# Patient Record
Sex: Male | Born: 1975 | Race: White | Hispanic: No | Marital: Single | Smoking: Former smoker
Health system: Southern US, Community
[De-identification: ages and names within clinical notes are randomized; demographics above are authoritative.]

---

## 2003-09-04 ENCOUNTER — Emergency Department (HOSPITAL_COMMUNITY): Admission: EM | Admit: 2003-09-04 | Discharge: 2003-09-04 | Payer: Self-pay | Admitting: Emergency Medicine

## 2004-12-31 ENCOUNTER — Emergency Department (HOSPITAL_COMMUNITY): Admission: EM | Admit: 2004-12-31 | Discharge: 2005-01-01 | Payer: Self-pay | Admitting: Emergency Medicine

## 2005-09-19 ENCOUNTER — Emergency Department (HOSPITAL_COMMUNITY): Admission: EM | Admit: 2005-09-19 | Discharge: 2005-09-19 | Payer: Self-pay | Admitting: Emergency Medicine

## 2008-03-30 ENCOUNTER — Ambulatory Visit: Payer: Self-pay | Admitting: Internal Medicine

## 2008-03-30 DIAGNOSIS — D2330 Other benign neoplasm of skin of unspecified part of face: Secondary | ICD-10-CM

## 2008-03-30 DIAGNOSIS — F528 Other sexual dysfunction not due to a substance or known physiological condition: Secondary | ICD-10-CM

## 2008-03-30 LAB — CONVERTED CEMR LAB
AST: 42 units/L — ABNORMAL HIGH (ref 0–37)
Albumin: 4.3 g/dL (ref 3.5–5.2)
Cholesterol: 226 mg/dL (ref 0–200)
Crystals: NEGATIVE
Direct LDL: 142.9 mg/dL
Eosinophils Absolute: 0.3 10*3/uL (ref 0.0–0.7)
GFR calc Af Amer: 100 mL/min
GFR calc non Af Amer: 82 mL/min
HCT: 49 % (ref 39.0–52.0)
HDL: 43.6 mg/dL (ref >39.0)
Hemoglobin, Urine: NEGATIVE
MCV: 95.4 fL (ref 78.0–100.0)
Monocytes Absolute: 0.5 10*3/uL (ref 0.1–1.0)
Monocytes Relative: 8.6 % (ref 3.0–12.0)
Neutrophils Relative %: 51.9 % (ref 43.0–77.0)
Nitrite: NEGATIVE
Platelets: 220 10*3/uL (ref 150–400)
RDW: 11.4 % — ABNORMAL LOW (ref 11.5–14.6)
Sodium: 140 meq/L (ref 135–145)
Specific Gravity, Urine: 1.025 (ref 1.000–1.03)
Testosterone: 398.64 ng/dL (ref 350.00–890)
Total CHOL/HDL Ratio: 5.2
Triglycerides: 169 mg/dL — ABNORMAL HIGH (ref 0–149)
Urobilinogen, UA: 0.2 (ref 0.0–1.0)

## 2008-03-31 DIAGNOSIS — J4520 Mild intermittent asthma, uncomplicated: Secondary | ICD-10-CM

## 2008-04-02 ENCOUNTER — Encounter: Payer: Self-pay | Admitting: Internal Medicine

## 2008-04-17 ENCOUNTER — Ambulatory Visit: Payer: Self-pay | Admitting: Internal Medicine

## 2008-04-17 DIAGNOSIS — R748 Abnormal levels of other serum enzymes: Secondary | ICD-10-CM | POA: Insufficient documentation

## 2008-04-17 DIAGNOSIS — F329 Major depressive disorder, single episode, unspecified: Secondary | ICD-10-CM

## 2008-04-17 LAB — CONVERTED CEMR LAB
AST: 18 units/L (ref 0–37)
Alkaline Phosphatase: 49 units/L (ref 39–117)
Bilirubin, Direct: 0.1 mg/dL (ref 0.0–0.3)
HCV Ab: NEGATIVE
Hep A Total Ab: NEGATIVE
Hepatitis B Surface Ag: NEGATIVE
Total Bilirubin: 1.2 mg/dL (ref 0.3–1.2)

## 2008-04-19 ENCOUNTER — Encounter: Payer: Self-pay | Admitting: Internal Medicine

## 2008-06-08 ENCOUNTER — Telehealth: Payer: Self-pay | Admitting: Internal Medicine

## 2008-06-19 ENCOUNTER — Ambulatory Visit: Payer: Self-pay | Admitting: Internal Medicine

## 2008-06-19 ENCOUNTER — Encounter: Payer: Self-pay | Admitting: Internal Medicine

## 2008-06-21 ENCOUNTER — Encounter: Admission: RE | Admit: 2008-06-21 | Discharge: 2008-06-21 | Payer: Self-pay | Admitting: Internal Medicine

## 2008-06-26 ENCOUNTER — Telehealth: Payer: Self-pay | Admitting: Internal Medicine

## 2008-06-27 ENCOUNTER — Encounter: Payer: Self-pay | Admitting: Internal Medicine

## 2008-06-29 ENCOUNTER — Encounter: Payer: Self-pay | Admitting: Internal Medicine

## 2008-07-13 ENCOUNTER — Telehealth: Payer: Self-pay | Admitting: Internal Medicine

## 2008-07-21 ENCOUNTER — Ambulatory Visit: Payer: Self-pay | Admitting: Endocrinology

## 2009-06-18 ENCOUNTER — Ambulatory Visit: Payer: Self-pay | Admitting: Internal Medicine

## 2009-06-19 ENCOUNTER — Encounter: Payer: Self-pay | Admitting: Internal Medicine

## 2009-07-03 ENCOUNTER — Encounter: Payer: Self-pay | Admitting: Internal Medicine

## 2009-09-05 ENCOUNTER — Ambulatory Visit (HOSPITAL_COMMUNITY): Admission: RE | Admit: 2009-09-05 | Discharge: 2009-09-05 | Payer: Self-pay | Admitting: Orthopedic Surgery

## 2010-03-21 ENCOUNTER — Ambulatory Visit: Payer: Self-pay | Admitting: Internal Medicine

## 2010-03-21 DIAGNOSIS — J45901 Unspecified asthma with (acute) exacerbation: Secondary | ICD-10-CM | POA: Insufficient documentation

## 2010-06-07 IMAGING — RF DG FLUORO GUIDE NDL PLC/BX
3 series · 3 of 3 positions shown · IV contrast (multihance)
Comparison: none

CLINICAL DATA: Snowboarding accident.  Pain at the left ring
finger MCP joint.  Question radial collateral ligament tear.

LEFT FOURTH MCP JOINT  INJECTION UNDER FLUOROSCOPY
The procedure, risks, benefits, and alternatives were explained to
the patient. Questions regarding the procedure were encouraged and
answered. Written consent was obtained.
TECHNIQUE: An appropriate skin entrance site was determined under
fluoroscopy. The site was marked, prepped with Betadine, draped in
the usual sterile fashion. Lidocaine was used for local anesthesia.
Subsequently, a 25 gauge butterfly needle was advanced into the
left fourth MCP joint under intermittent fluoroscopy.
Approximately 1 cc of a mixture of 0.05 ml Multihance in 5 ml
Sanjaen 60 and 5 cc lidocaine was then used to opacify the left
fourth MCP joint.  No immediate complication.

[Series 1: run · 1 of 1 slices shown (1 of 3)]
[im 1/1]
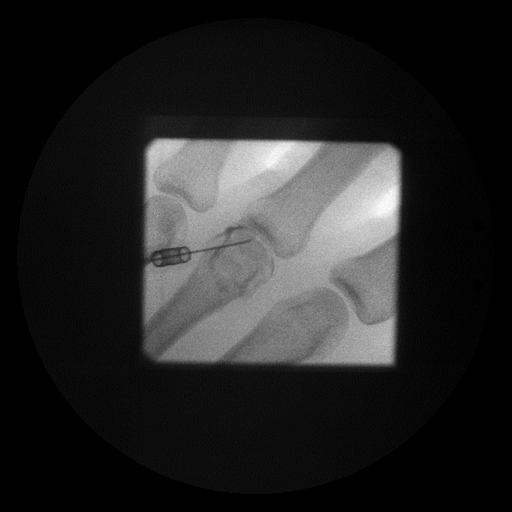

[Series 2: run · 1 of 1 slices shown (2 of 3)]
[im 1/1]
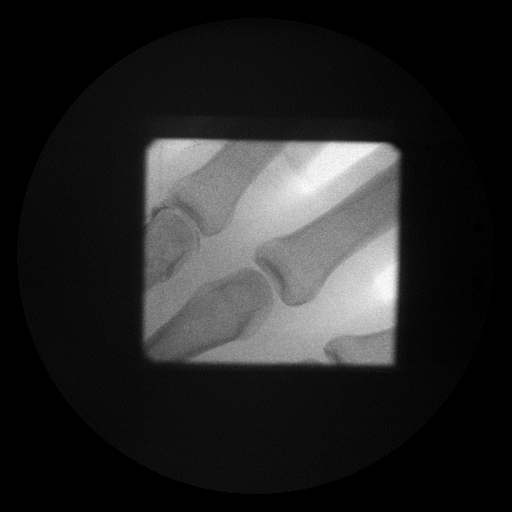

[Series 3: run · 1 of 1 slices shown (3 of 3)]
[im 1/1]
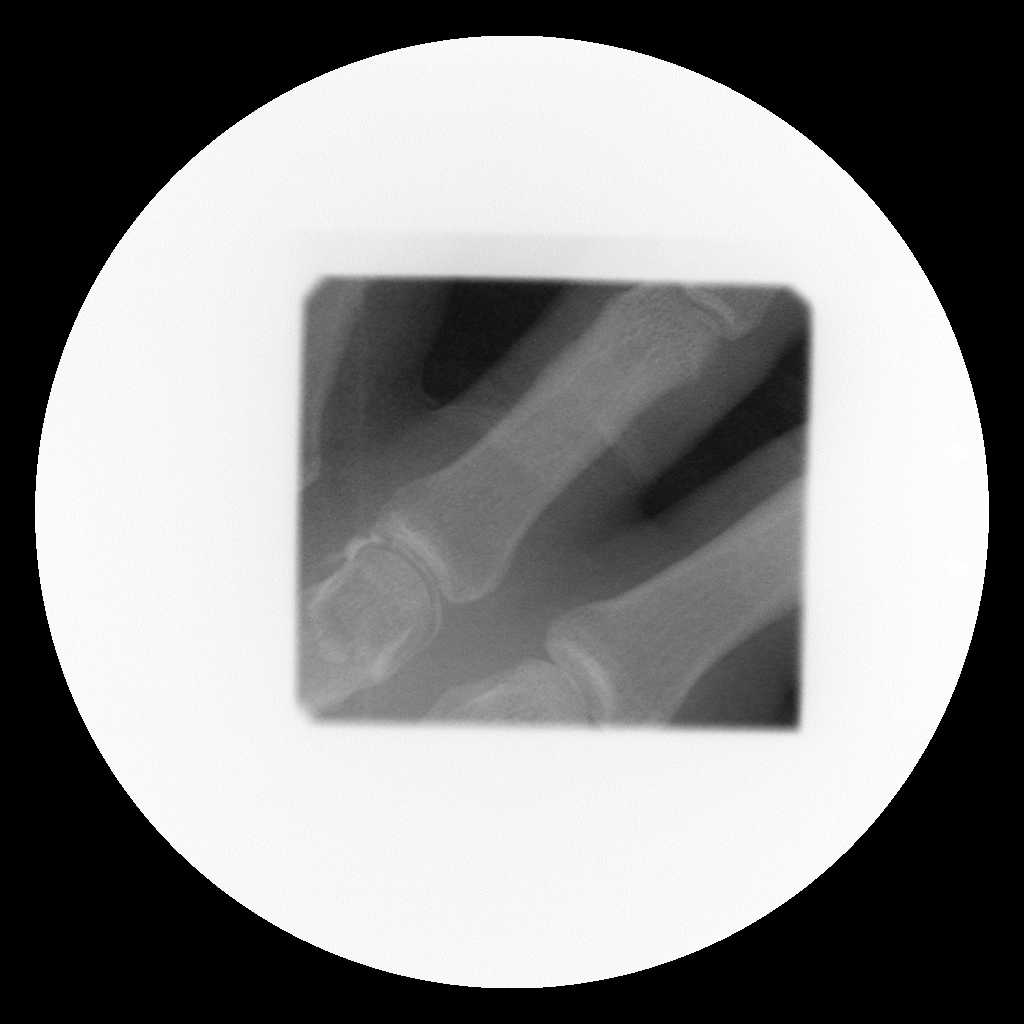

[3 of 3 positions shown; findings below may reference images not displayed]

IMPRESSION: Technically successful left fourth MCP joint injection for MRI.

## 2010-06-18 NOTE — Assessment & Plan Note (Signed)
Summary: HEAD CHEST CONGEST--RUNNY NOSE  CHEST TIGHTNESS X LAST WK/TUE...   Vital Signs:  Patient profile:   35 year old male Height:      67 inches Weight:      153 pounds BMI:     24.05 O2 Sat:      92 % on Room air Temp:     98.7 degrees F oral Pulse rate:   60 / minute Pulse rhythm:   regular Resp:     16 per minute BP sitting:   120 / 80  (left arm) Cuff size:   large  Vitals Entered By: Rock Nephew CMA (March 21, 2010 3:10 PM)  O2 Flow:  Room air CC: pt c/o cough, congestion, body ache, , URI symptoms   Primary Care Provider:  Etta Grandchild MD  CC:  pt c/o cough, congestion, body ache, , and URI symptoms.  History of Present Illness:  URI Symptoms      This is a 35 year old man who presents with URI symptoms.  The symptoms began 1 week ago.  The severity is described as mild.  The patient reports nasal congestion, clear nasal discharge, and dry cough, but denies purulent nasal discharge, sore throat, productive cough, earache, and sick contacts.  Associated symptoms include dyspnea and wheezing.  The patient denies fever, stiff neck, rash, vomiting, diarrhea, use of an antipyretic, and response to antipyretic.  The patient also reports sneezing and seasonal symptoms.  The patient denies headache, muscle aches, and severe fatigue.  The patient denies the following risk factors for Strep sinusitis: unilateral facial pain, unilateral nasal discharge, poor response to decongestant, double sickening, tooth pain, Strep exposure, tender adenopathy, and absence of cough.    Preventive Screening-Counseling & Management  Alcohol-Tobacco     Alcohol drinks/day: 1     Alcohol type: all     >5/day in last 3 mos: no     Alcohol Counseling: not indicated; use of alcohol is not excessive or problematic     Feels need to cut down: no     Feels annoyed by complaints: no     Feels guilty re: drinking: no     Needs 'eye opener' in am: no     Smoking Status: quit     Year Quit:  2008     Pack years: 10     Passive Smoke Exposure: no     Tobacco Counseling: to remain off tobacco products  Hep-HIV-STD-Contraception     Hepatitis Risk: no risk noted     HIV Risk: no     STD Risk: no risk noted      Drug Use:  no.    Medications Prior to Update: 1)  Advair Diskus 100-50 Mcg/dose Misc (Fluticasone-Salmeterol) .... Inhale 1 Dose By Mouth Twice A Day 2)  Levitra 20 Mg Tabs (Vardenafil Hcl) .... As Directed 3)  Proair Hfa 108 (90 Base) Mcg/act Aers (Albuterol Sulfate) .... 2 Puffs Qid For Asthma  Current Medications (verified): 1)  Advair Diskus 100-50 Mcg/dose Misc (Fluticasone-Salmeterol) .... Inhale 1 Dose By Mouth Twice A Day 2)  Levitra 20 Mg Tabs (Vardenafil Hcl) .... As Directed 3)  Proair Hfa 108 (90 Base) Mcg/act Aers (Albuterol Sulfate) .... 2 Puffs Qid For Asthma  Allergies (verified): 1)  ! Fluzone (Influenza Virus Vaccine Split)  Past History:  Past Medical History: Last updated: 03/30/2008 Asthma  Past Surgical History: Last updated: 03/30/2008 Denies surgical history  Family History: Last updated: 03/30/2008 Mother-COPD Father-Diverticulitis  Family History Diabetes 1st degree relative  Social History: Last updated: 04/17/2008 Single Alcohol use-yes Drug use-no Regular exercise-yes  Risk Factors: Alcohol Use: 1 (03/21/2010) >5 drinks/d w/in last 3 months: no (03/21/2010) Exercise: yes (04/17/2008)  Risk Factors: Smoking Status: quit (03/21/2010) Passive Smoke Exposure: no (03/21/2010)  Family History: Reviewed history from 03/30/2008 and no changes required. Mother-COPD Father-Diverticulitis Family History Diabetes 1st degree relative  Social History: Reviewed history from 04/17/2008 and no changes required. Single Alcohol use-yes Drug use-no Regular exercise-yes Hepatitis Risk:  no risk noted STD Risk:  no risk noted  Review of Systems  The patient denies anorexia, fever, weight loss, weight gain, hoarseness,  chest pain, syncope, dyspnea on exertion, peripheral edema, headaches, hemoptysis, abdominal pain, suspicious skin lesions, enlarged lymph nodes, and angioedema.    Physical Exam  General:  alert, well-developed, well-nourished, well-hydrated, appropriate dress, normal appearance, healthy-appearing, and cooperative to examination.   Head:  normocephalic, atraumatic, no abnormalities observed, and no abnormalities palpated.   Ears:  R ear normal and L ear normal.   Nose:  External nasal examination shows no deformity or inflammation. Nasal mucosa are pink and moist without lesions or exudates. Mouth:  Oral mucosa and oropharynx without lesions or exudates.  Teeth in good repair. Neck:  supple, full ROM, no masses, no thyromegaly, no JVD, normal carotid upstroke, no carotid bruits, no cervical lymphadenopathy, and no neck tenderness.   Lungs:  normal respiratory effort, no intercostal retractions, no accessory muscle use, normal breath sounds, no dullness, no fremitus, no crackles, R wheezes, and L wheezes.   Heart:  normal rate, regular rhythm, no murmur, no gallop, no rub, and no JVD.   Abdomen:  soft, non-tender, normal bowel sounds, no distention, no masses, no guarding, no rigidity, no rebound tenderness, no abdominal hernia, no inguinal hernia, no hepatomegaly, and no splenomegaly.   Msk:  No deformity or scoliosis noted of thoracic or lumbar spine.   Pulses:  R and L carotid,radial,femoral,dorsalis pedis and posterior tibial pulses are full and equal bilaterally Extremities:  No clubbing, cyanosis, edema, or deformity noted with normal full range of motion of all joints.   Neurologic:  No cranial nerve deficits noted. Station and gait are normal. Plantar reflexes are down-going bilaterally. DTRs are symmetrical throughout. Sensory, motor and coordinative functions appear intact. Skin:  turgor normal, color normal, no rashes, no suspicious lesions, no ecchymoses, no petechiae, no purpura, no  ulcerations, and no edema.   Cervical Nodes:  no anterior cervical adenopathy and no posterior cervical adenopathy.   Axillary Nodes:  no R axillary adenopathy and no L axillary adenopathy.   Inguinal Nodes:  no R inguinal adenopathy and no L inguinal adenopathy.   Psych:  Cognition and judgment appear intact. Alert and cooperative with normal attention span and concentration. No apparent delusions, illusions, hallucinations Additional Exam:  He received a jet neb of Xopenex 1.25 mg and afterwards his lungs are CTA on both sides   Impression & Recommendations:  Problem # 1:  ASTHMA UNSPECIFIED WITH EXACERBATION (ZOX-096.04) Assessment New  His updated medication list for this problem includes:    Advair Diskus 100-50 Mcg/dose Misc (Fluticasone-salmeterol) ..... Inhale 1 dose by mouth twice a day    Proair Hfa 108 (90 Base) Mcg/act Aers (Albuterol sulfate) .Marland Kitchen... 2 puffs qid for asthma  Orders: Nebulizer Tx (54098)  Problem # 2:  ASTHMA (ICD-493.90) Assessment: Deteriorated  His updated medication list for this problem includes:    Advair Diskus 100-50 Mcg/dose Misc (Fluticasone-salmeterol) .Marland KitchenMarland KitchenMarland KitchenMarland Kitchen  Inhale 1 dose by mouth twice a day    Proair Hfa 108 (90 Base) Mcg/act Aers (Albuterol sulfate) .Marland Kitchen... 2 puffs qid for asthma  Orders: Admin of Therapeutic Inj  intramuscular or subcutaneous (16109) Depo- Medrol 40mg  (J1030)  Complete Medication List: 1)  Advair Diskus 100-50 Mcg/dose Misc (Fluticasone-salmeterol) .... Inhale 1 dose by mouth twice a day 2)  Levitra 20 Mg Tabs (Vardenafil hcl) .... As directed 3)  Proair Hfa 108 (90 Base) Mcg/act Aers (Albuterol sulfate) .... 2 puffs qid for asthma  Patient Instructions: 1)  Please schedule a follow-up appointment in 2 weeks. 2)  Get plenty of rest, drink lots of clear liquids, and use Tylenol or Ibuprofen for fever and comfort. Return in 7-10 days if you're not better:sooner if you're feeling worse. Prescriptions: ADVAIR DISKUS 100-50  MCG/DOSE MISC (FLUTICASONE-SALMETEROL) inhale 1 dose by mouth twice a day  #40mo x 11   Entered and Authorized by:   Etta Grandchild MD   Signed by:   Etta Grandchild MD on 03/21/2010   Method used:   Electronically to        [facility]. [phone]* (retail)       [phone]       [place], [place]  [address]       Ph: [phone]       Fax: [phone]   RxID:   [id number]    Medication Administration  Injection # 1:    Medication: Depo- Medrol 40mg     Diagnosis: ASTHMA (ICD-493.90)    Route: IM    Site: L deltoid    Exp Date: 08/2012    Lot #: [id number]    Mfr: [email or web address]    Comments: pt received 120mg     Patient tolerated injection without complications    Given by: Rock Nephew CMA (March 21, 2010 3:37 PM)  Orders Added: 1)  Admin of Therapeutic Inj  intramuscular or subcutaneous [96372] 2)  Depo- Medrol 40mg  [J1030] 3)  Est. Patient Level IV [36644] 4)  Nebulizer Tx [03474]     Medication Administration  Injection # 1:    Medication: Depo- Medrol 40mg     Diagnosis: ASTHMA (ICD-493.90)    Route: IM    Site: L deltoid    Exp Date: 08/2012    Lot #: [id number]    Mfr: [email or web address]    Comments: pt received 120mg     Patient tolerated injection without complications    Given by: Rock Nephew CMA (March 21, 2010 3:37 PM)  Orders Added: 1)  Admin of Therapeutic Inj  intramuscular or subcutaneous [96372] 2)  Depo- Medrol 40mg  [J1030] 3)  Est. Patient Level IV [25956] 4)  Nebulizer Tx [38756]

## 2010-06-18 NOTE — Consult Note (Signed)
Summary: [facility]  [facility]   Imported By: Lester [place] 06/25/2009 07:58:15  _____________________________________________________________________  External Attachment:    Type:   Image     Comment:   External Document

## 2010-06-18 NOTE — Letter (Signed)
Summary: [facility]  [facility]   Imported By: Sherian Rein 07/09/2009 13:55:33  _____________________________________________________________________  External Attachment:    Type:   Image     Comment:   External Document

## 2010-06-18 NOTE — Assessment & Plan Note (Signed)
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 No Pain Assessment Patient in pain? yes     Location: (L) hand Type: aching/swollen   Primary Care Provider:  Etta Grandchild MD  CC:  Injured (L) hand.  History of Present Illness: here c/o left hand injury - onset sat (>48  ago) - uncertain what ocurred when he fell - ?hit hand against pole or board instant pain and swelling in palm of hand a/w bruising -  no wrist pain, not wearing wrist gaurds -  no head injury or LOC - felt unable to move ring finger - "it just hung there" for 1 st 24h - now radiation of pain into wrist no numbness or tingling buddy tapped 3rd and 4th fingers -  cont pain now but swelling has improved not taking anything for pain control  Current Medications (verified): 1)  Advair Diskus 100-50 Mcg/dose Misc (Fluticasone-Salmeterol) .... Inhale 1 Dose By Mouth Twice A Day 2)  Levitra 20 Mg Tabs (Vardenafil Hcl) .... As Directed 3)  Proair Hfa 108 (90 Base) Mcg/act Aers (Albuterol Sulfate) .... 2 Puffs Qid For Asthma  Allergies (verified): 1)  ! Fluzone (Influenza Virus Vaccine Split)  Past History:  Past Medical History: Last updated: 03/30/2008 Asthma  Review of Systems  The patient denies fever, syncope, peripheral edema, headaches, and enlarged lymph nodes.    Physical Exam  General:  normal appearance.   Lungs:  normal respiratory effort, no intercostal retractions or use of accessory muscles; normal breath sounds bilaterally - no crackles and no wheezes.      Heart:  normal rate, regular rhythm, no murmur, and no rub. BLE without edema. Msk:  left hand - wrist with FROM, no swelling or pain to snuff box palpation - ++pain to palpation over ring>long finger on palmar MCP region, +brusing on palmar side across this area - all digits have normal flexion and extention fx and all neurovasc intact - mild swelling over MCPs on dorsal surface   Impression & Recommendations:  Problem # 1:  HAND PAIN, LEFT (ICD-729.5) pain and swelling s/p snowboarding accident - check plain film now r/o fx - refer to hand if +fx - no pain meds needed - cont to buddy tape and ice -  caution advised with future sporting activity and encouraged use of saftey devices  Orders: T-Hand Left 3 Views (73130TC) Orthopedic Referral (Ortho)  addendum - xray without evidence for fx - suspect hyperextention of MCP causing pain and swelling -  doubt need for MRI at this time given FROM and improvment in swelling however given upcoming competition event 2/11 will send to sport med doc to review injury further and ?need for protective device or splint to prevent re-injury these results and recs d/w pt who understands and agrees  Complete Medication List: 1)  Advair Diskus 100-50 Mcg/dose Misc (Fluticasone-salmeterol) .Marland KitchenMarland KitchenMarland Kitchen  Inhale 1 dose by mouth twice a day 2)  Levitra 20 Mg Tabs (Vardenafil hcl) .... As directed 3)  Proair Hfa 108 (90 Base) Mcg/act Aers (Albuterol sulfate) .... 2 puffs qid for asthma  Patient Instructions: 1)  xray ordered today - your results will be called to you - 2)  continue to buddy tape the 3rd and 4th fingers as you have been doing - 3)  ice to swollen area 3x/day and as needed for next 5 days

## 2011-01-22 ENCOUNTER — Encounter: Payer: Self-pay | Admitting: Internal Medicine

## 2011-01-22 ENCOUNTER — Ambulatory Visit (INDEPENDENT_AMBULATORY_CARE_PROVIDER_SITE_OTHER): Payer: 59 | Admitting: Internal Medicine

## 2011-01-22 DIAGNOSIS — J45909 Unspecified asthma, uncomplicated: Secondary | ICD-10-CM

## 2011-01-22 DIAGNOSIS — J209 Acute bronchitis, unspecified: Secondary | ICD-10-CM

## 2011-01-22 DIAGNOSIS — J45901 Unspecified asthma with (acute) exacerbation: Secondary | ICD-10-CM

## 2011-01-22 DIAGNOSIS — Z23 Encounter for immunization: Secondary | ICD-10-CM

## 2011-01-22 MED ORDER — FLUTICASONE-SALMETEROL 250-50 MCG/DOSE IN AEPB: 1.0000 | INHALATION_SPRAY | Freq: Two times a day (BID) | RESPIRATORY_TRACT | Status: DC

## 2011-01-22 MED ORDER — MOXIFLOXACIN HCL 400 MG PO TABS: 400.0000 mg | ORAL_TABLET | Freq: Every day | ORAL | Status: AC

## 2011-01-22 NOTE — Assessment & Plan Note (Signed)
Continue advair diskus 

## 2011-01-22 NOTE — Patient Instructions (Signed)
Acute Bronchitis You have acute bronchitis. This means you have a chest cold. The airways in your lungs are inflamed (red and sore). Acute means it is sudden onset. Bronchitis is most often caused by a virus. In smokers, people with chronic lung problems, and elderly patients, treatment with antibiotics for bacterial infection may be needed. Exposure to cigarette smoke or irritating chemicals will make bronchitis worse. Allergies and asthma can also make bronchitis worse. Repeated episodes of bronchitis may cause long standing lung problems. Acute bronchitis is usually treated with rest, fluids, and medicines for relief of fever or cough. Bronchodilator medicines from metered inhalers or a nebulizer may be used to help open up the small airways. This reduces shortness of breath and helps control cough. Antibiotics can be prescribed if you are more seriously ill or at risk. A cool air vaporizer may help thin bronchial secretions and make it easier to clear your chest. Increased fluids may also help. You must avoid smoking, even second hand exposure. If you are a cigarette smoker, consider using nicotine gum or skin patches to help control withdrawal symptoms. Recovery from bronchitis is often slow, but you should start feeling better after 2-3 days. Cough from bronchitis frequently lasts for 3-4 weeks.  SEEK IMMEDIATE MEDICAL CARE IF YOU DEVELOP:  Increased fever, chills, or chest pain.   Severe shortness of breath or bloody sputum.   Dehydration, fainting, repeated vomiting, severe headache.   No improvement after one week of proper treatment.  MAKE SURE YOU:   Understand these instructions.   Will watch your condition.   Will get help right away if you are not doing well or get worse.  Document Released: 06/12/2004 Document Re-Released: 04/17/2008 [employer] Patient Information 2011 [employer]. 

## 2011-01-22 NOTE — Progress Notes (Signed)
Subjective:    Patient ID: Samuel Mccarty, male    DOB: Jan 30, 1976, 35 y.o.   MRN: [id number]  URI  This is a new problem. The current episode started 1 to 4 weeks ago. The problem has been gradually worsening. There has been no fever. Associated symptoms include congestion, coughing (productive of yellow/green phlegm), rhinorrhea and a sore throat. Pertinent negatives include no abdominal pain, chest pain, diarrhea, dysuria, ear pain, headaches, joint pain, joint swelling, nausea, neck pain, plugged ear sensation, rash, sinus pain, sneezing, swollen glands, vomiting or wheezing. He has tried inhaler use for the symptoms. The treatment provided no relief.      Review of Systems  Constitutional: Negative for fever, chills, diaphoresis, activity change, appetite change, fatigue and unexpected weight change.  HENT: Positive for congestion, sore throat and rhinorrhea. Negative for ear pain, facial swelling, sneezing, trouble swallowing, neck pain, voice change, postnasal drip and ear discharge.   Eyes: Negative.   Respiratory: Positive for cough (productive of yellow/green phlegm). Negative for apnea, choking, chest tightness, shortness of breath, wheezing and stridor.   Cardiovascular: Negative for chest pain, palpitations and leg swelling.  Gastrointestinal: Negative for nausea, vomiting, abdominal pain and diarrhea.  Genitourinary: Negative.  Negative for dysuria.  Musculoskeletal: Negative.  Negative for joint pain.  Skin: Negative for color change, pallor, rash and wound.  Neurological: Negative.  Negative for headaches.  Hematological: Negative.   Psychiatric/Behavioral: Negative.        Objective:   Physical Exam  Vitals reviewed. Constitutional: He is oriented to person, place, and time. He appears well-developed and well-nourished. No distress.  HENT:  Head: No trismus in the jaw.  Right Ear: Hearing, tympanic membrane, external ear and ear canal normal.  Left Ear: Hearing,  tympanic membrane, external ear and ear canal normal.  Nose: No mucosal edema, rhinorrhea or sinus tenderness. Right sinus exhibits no maxillary sinus tenderness and no frontal sinus tenderness. Left sinus exhibits no maxillary sinus tenderness and no frontal sinus tenderness.  Mouth/Throat: Oropharynx is clear and moist and mucous membranes are normal. Mucous membranes are not pale, not dry and not cyanotic. No uvula swelling. No oropharyngeal exudate, posterior oropharyngeal edema, posterior oropharyngeal erythema or tonsillar abscesses.  Eyes: Conjunctivae are normal. Right eye exhibits no discharge. Left eye exhibits no discharge. No scleral icterus.  Neck: Normal range of motion. Neck supple. No JVD present. No tracheal deviation present. No thyromegaly present.  Cardiovascular: Normal rate, regular rhythm, normal heart sounds and intact distal pulses.  Exam reveals no gallop and no friction rub.   No murmur heard. Pulmonary/Chest: Effort normal and breath sounds normal. No stridor. No respiratory distress. He has no wheezes. He has no rales. He exhibits no tenderness.  Abdominal: Soft. Bowel sounds are normal. He exhibits no distension and no mass. There is no tenderness. There is no rebound and no guarding.  Musculoskeletal: Normal range of motion. He exhibits no tenderness.  Lymphadenopathy:    He has no cervical adenopathy.  Neurological: He is oriented to person, place, and time. He displays normal reflexes. He exhibits normal muscle tone. Coordination abnormal.  Skin: Skin is warm and dry. No rash noted. He is not diaphoretic. No erythema. No pallor.  Psychiatric: He has a normal mood and affect. His behavior is normal. Judgment and thought content normal.     Lab Results  Component Value Date   WBC 5.7 03/30/2008   HGB 16.7 03/30/2008   HCT 49.0 03/30/2008   PLT 220 03/30/2008  CHOL 226* 03/30/2008   TRIG 169* 03/30/2008   HDL 43.6 03/30/2008   LDLDIRECT 142.9 03/30/2008    ALT 15 04/17/2008   AST 18 04/17/2008   NA 140 03/30/2008   K 4.3 03/30/2008   CL 102 03/30/2008   CREATININE 1.1 03/30/2008   BUN 23 03/30/2008   CO2 31 03/30/2008   TSH 1.04 03/30/2008   PSA 0.57 03/30/2008       Assessment & Plan:

## 2011-01-22 NOTE — Assessment & Plan Note (Signed)
Start avelox for bacterial infection

## 2011-03-15 ENCOUNTER — Other Ambulatory Visit: Payer: Self-pay | Admitting: Internal Medicine

## 2012-04-05 ENCOUNTER — Other Ambulatory Visit: Payer: Self-pay | Admitting: Internal Medicine

## 2013-12-16 ENCOUNTER — Ambulatory Visit (INDEPENDENT_AMBULATORY_CARE_PROVIDER_SITE_OTHER): Payer: 59 | Admitting: Internal Medicine

## 2013-12-16 ENCOUNTER — Encounter: Payer: Self-pay | Admitting: Internal Medicine

## 2013-12-16 VITALS — BP 114/74 | HR 61 | Temp 98.3°F | Resp 16 | Ht 67.0 in | Wt 152.8 lb

## 2013-12-16 DIAGNOSIS — Z720 Tobacco use: Secondary | ICD-10-CM | POA: Insufficient documentation

## 2013-12-16 DIAGNOSIS — J45909 Unspecified asthma, uncomplicated: Secondary | ICD-10-CM

## 2013-12-16 DIAGNOSIS — J4520 Mild intermittent asthma, uncomplicated: Secondary | ICD-10-CM

## 2013-12-16 MED ORDER — ALBUTEROL SULFATE 108 (90 BASE) MCG/ACT IN AEPB: 1.0000 | INHALATION_SPRAY | Freq: Four times a day (QID) | RESPIRATORY_TRACT | Status: DC | PRN

## 2013-12-16 NOTE — Patient Instructions (Signed)

## 2013-12-16 NOTE — Progress Notes (Signed)
Pre visit review using our clinic review tool, if applicable. No additional management support is needed unless otherwise documented below in the visit note. 

## 2013-12-17 NOTE — Assessment & Plan Note (Signed)
He is not willing to start an ICS He was not willing to get a pneumovax today Will cont albuterol as needed

## 2013-12-17 NOTE — Progress Notes (Signed)
   Subjective:    Patient ID: Samuel Mccarty, male    DOB: 1975/12/19, 38 y.o.   MRN: [id number]  Asthma He complains of wheezing. There is no chest tightness, cough, difficulty breathing, frequent throat clearing, hemoptysis, hoarse voice, shortness of breath or sputum production. This is a chronic problem. The current episode started more than 1 year ago. The problem occurs intermittently. The problem has been unchanged. Pertinent negatives include no appetite change, chest pain, dyspnea on exertion, ear congestion, ear pain, fever, headaches, heartburn, malaise/fatigue, myalgias, nasal congestion, orthopnea, PND, postnasal drip, rhinorrhea, sneezing, sore throat, sweats, trouble swallowing or weight loss. His symptoms are aggravated by pollen. His symptoms are alleviated by beta-agonist. He reports significant improvement on treatment. His past medical history is significant for asthma.      Review of Systems  Constitutional: Negative for fever, weight loss, malaise/fatigue and appetite change.  HENT: Negative for ear pain, hoarse voice, postnasal drip, rhinorrhea, sneezing, sore throat and trouble swallowing.   Respiratory: Positive for wheezing. Negative for cough, hemoptysis, sputum production and shortness of breath.   Cardiovascular: Negative for chest pain, dyspnea on exertion and PND.  Gastrointestinal: Negative for heartburn.  Musculoskeletal: Negative for myalgias.  Neurological: Negative for headaches.  All other systems reviewed and are negative.      Objective:   Physical Exam  Vitals reviewed. Constitutional: He is oriented to person, place, and time. He appears well-developed and well-nourished.  Non-toxic appearance. He does not have a sickly appearance. He does not appear ill. No distress.  HENT:  Head: Normocephalic and atraumatic.  Mouth/Throat: Oropharynx is clear and moist. No oropharyngeal exudate.  Eyes: Conjunctivae are normal. Right eye exhibits no discharge.  Left eye exhibits no discharge. No scleral icterus.  Neck: Normal range of motion. Neck supple. No JVD present. No tracheal deviation present. No thyromegaly present.  Cardiovascular: Normal rate, regular rhythm, normal heart sounds and intact distal pulses.  Exam reveals no gallop and no friction rub.   No murmur heard. Pulmonary/Chest: Effort normal and breath sounds normal. No stridor. No respiratory distress. He has no wheezes. He has no rales. He exhibits no tenderness.  Abdominal: Soft. Bowel sounds are normal. He exhibits no distension and no mass. There is no tenderness. There is no rebound and no guarding.  Musculoskeletal: Normal range of motion. He exhibits no edema and no tenderness.  Lymphadenopathy:    He has no cervical adenopathy.  Neurological: He is oriented to person, place, and time.  Skin: Skin is warm and dry. No rash noted. He is not diaphoretic. No erythema. No pallor.          Assessment & Plan:

## 2015-07-24 ENCOUNTER — Telehealth: Payer: Self-pay | Admitting: Internal Medicine

## 2015-07-24 DIAGNOSIS — J4520 Mild intermittent asthma, uncomplicated: Secondary | ICD-10-CM

## 2015-07-24 MED ORDER — ALBUTEROL SULFATE 108 (90 BASE) MCG/ACT IN AEPB: 1.0000 | INHALATION_SPRAY | Freq: Four times a day (QID) | RESPIRATORY_TRACT | Status: DC | PRN

## 2015-07-24 NOTE — Telephone Encounter (Signed)
done

## 2015-07-24 NOTE — Telephone Encounter (Signed)
Pt called request refill for Albuterol Sulfate (PROAIR RESPICLICK) 123XX123 (90 BASE) MCG/ACT AEPB to be send to [facility]. Pt has an appt with Dr. Ronnald Ramp on 07/31/15.

## 2015-07-31 ENCOUNTER — Other Ambulatory Visit (INDEPENDENT_AMBULATORY_CARE_PROVIDER_SITE_OTHER): Payer: Self-pay

## 2015-07-31 ENCOUNTER — Encounter: Payer: Self-pay | Admitting: Internal Medicine

## 2015-07-31 ENCOUNTER — Ambulatory Visit (INDEPENDENT_AMBULATORY_CARE_PROVIDER_SITE_OTHER): Payer: Self-pay | Admitting: Internal Medicine

## 2015-07-31 VITALS — BP 110/70 | HR 60 | Temp 97.8°F | Resp 16 | Ht 67.0 in | Wt 159.0 lb

## 2015-07-31 DIAGNOSIS — Z0000 Encounter for general adult medical examination without abnormal findings: Secondary | ICD-10-CM

## 2015-07-31 DIAGNOSIS — A609 Anogenital herpesviral infection, unspecified: Secondary | ICD-10-CM

## 2015-07-31 DIAGNOSIS — G63 Polyneuropathy in diseases classified elsewhere: Secondary | ICD-10-CM

## 2015-07-31 DIAGNOSIS — R202 Paresthesia of skin: Secondary | ICD-10-CM

## 2015-07-31 DIAGNOSIS — A6002 Herpesviral infection of other male genital organs: Secondary | ICD-10-CM | POA: Insufficient documentation

## 2015-07-31 DIAGNOSIS — J4520 Mild intermittent asthma, uncomplicated: Secondary | ICD-10-CM

## 2015-07-31 DIAGNOSIS — R200 Anesthesia of skin: Secondary | ICD-10-CM

## 2015-07-31 DIAGNOSIS — E538 Deficiency of other specified B group vitamins: Secondary | ICD-10-CM | POA: Insufficient documentation

## 2015-07-31 LAB — CBC WITH DIFFERENTIAL/PLATELET
Basophils Absolute: 0.1 10*3/uL (ref 0.0–0.1)
Basophils Relative: 0.8 % (ref 0.0–3.0)
EOS PCT: 1.8 % (ref 0.0–5.0)
Eosinophils Absolute: 0.2 10*3/uL (ref 0.0–0.7)
HEMATOCRIT: 43.2 % (ref 39.0–52.0)
HEMOGLOBIN: 14.6 g/dL (ref 13.0–17.0)
Lymphocytes Relative: 38.1 % (ref 12.0–46.0)
Lymphs Abs: 3.4 10*3/uL (ref 0.7–4.0)
MCHC: 33.9 g/dL (ref 30.0–36.0)
MCV: 90.5 fl (ref 78.0–100.0)
MONOS PCT: 5.5 % (ref 3.0–12.0)
Monocytes Absolute: 0.5 10*3/uL (ref 0.1–1.0)
Neutro Abs: 4.8 10*3/uL (ref 1.4–7.7)
Neutrophils Relative %: 53.8 % (ref 43.0–77.0)
Platelets: 234 10*3/uL (ref 150.0–400.0)
RBC: 4.78 Mil/uL (ref 4.22–5.81)
RDW: 12.3 % (ref 11.5–15.5)
WBC: 9 10*3/uL (ref 4.0–10.5)

## 2015-07-31 LAB — COMPREHENSIVE METABOLIC PANEL
ALBUMIN: 4.5 g/dL (ref 3.5–5.2)
ALK PHOS: 59 U/L (ref 39–117)
ALT: 27 U/L (ref 0–53)
AST: 20 U/L (ref 0–37)
BUN: 16 mg/dL (ref 6–23)
CO2: 27 mEq/L (ref 19–32)
Calcium: 9.7 mg/dL (ref 8.4–10.5)
Chloride: 107 mEq/L (ref 96–112)
Creatinine, Ser: 1.04 mg/dL (ref 0.40–1.50)
GFR: 84.28 mL/min (ref >60.00)
Glucose, Bld: 96 mg/dL (ref 70–99)
POTASSIUM: 3.8 meq/L (ref 3.5–5.1)
Sodium: 143 mEq/L (ref 135–145)
TOTAL PROTEIN: 6.9 g/dL (ref 6.0–8.3)
Total Bilirubin: 0.5 mg/dL (ref 0.2–1.2)

## 2015-07-31 LAB — TSH: TSH: 0.67 u[IU]/mL (ref 0.35–4.50)

## 2015-07-31 LAB — FOLATE: Folate: 6.5 ng/mL (ref >5.9)

## 2015-07-31 LAB — LIPID PANEL
CHOLESTEROL: 207 mg/dL — AB (ref 0–200)
HDL: 37.3 mg/dL — AB (ref >39.00)
LDL CALC: 133 mg/dL — AB (ref 0–99)
NONHDL: 169.89
Total CHOL/HDL Ratio: 6
Triglycerides: 185 mg/dL — ABNORMAL HIGH (ref 0.0–149.0)
VLDL: 37 mg/dL (ref 0.0–40.0)

## 2015-07-31 LAB — VITAMIN B12: Vitamin B-12: 254 pg/mL (ref 211–911)

## 2015-07-31 MED ORDER — VALACYCLOVIR HCL 1 G PO TABS: 1000.0000 mg | ORAL_TABLET | Freq: Three times a day (TID) | ORAL | Status: AC

## 2015-07-31 MED ORDER — CYANOCOBALAMIN 2000 MCG PO TABS: 2000.0000 ug | ORAL_TABLET | Freq: Every day | ORAL | Status: AC

## 2015-07-31 NOTE — Patient Instructions (Signed)

## 2015-07-31 NOTE — Progress Notes (Signed)
Subjective:  Patient ID: Samuel Mccarty, male    DOB: Dec 12, 1975  Age: 40 y.o. MRN: [id number]  CC: Asthma and Annual Exam   HPI Samuel Mccarty presents for a CPX.  He tells me that his asthma has been well controlled and he has not used his albuterol inhaler in several weeks. He is not willing to start an inhaled corticosteroid.  He complains of a painful lesion on the tip of his penis. The lesion developed about a week ago and is not resolving very quickly. He has a prior history of positive serology for HSV 2. He denies lymphadenopathy, dysuria, penile discharge.  He complains of numbness and tingling in both upper extremities from the elbows down to his hands. He notices the symptoms mostly at night when he is sleeping. He also has some symptoms throughout the day as well but not as prominent. This is been going on for about 4 months. He rarely has discomfort in his neck.  Outpatient Prescriptions Prior to Visit  Medication Sig Dispense Refill  . Albuterol Sulfate (PROAIR RESPICLICK) 123XX123 (90 Base) MCG/ACT AEPB Inhale 1 puff into the lungs 4 (four) times daily as needed. 1 each 11   No facility-administered medications prior to visit.    ROS Review of Systems  Constitutional: Negative for fever, chills, diaphoresis, appetite change and fatigue.  HENT: Negative.   Eyes: Negative.  Negative for visual disturbance.  Respiratory: Negative.   Cardiovascular: Negative.   Gastrointestinal: Negative.  Negative for abdominal pain.  Endocrine: Negative.   Genitourinary: Positive for genital sores. Negative for dysuria, urgency, frequency, hematuria, flank pain, decreased urine volume, discharge, penile swelling, scrotal swelling, enuresis, difficulty urinating, penile pain and testicular pain.  Musculoskeletal: Negative.  Negative for myalgias, joint swelling, arthralgias, gait problem, neck pain and neck stiffness.  Skin: Negative.  Negative for color change and rash.    Allergic/Immunologic: Negative.   Neurological: Positive for dizziness and numbness. Negative for tremors, seizures, facial asymmetry, weakness and light-headedness.  Hematological: Negative.  Negative for adenopathy. Does not bruise/bleed easily.  Psychiatric/Behavioral: Negative.     Objective:  BP 110/70 mmHg  Pulse 60  Temp(Src) 97.8 F (36.6 C) (Oral)  Resp 16  Ht 5\' 7"  (1.702 m)  Wt 159 lb (72.122 kg)  BMI 24.90 kg/m2  SpO2 97%  BP Readings from Last 3 Encounters:  07/31/15 110/70  12/16/13 114/74  01/22/11 122/76    Wt Readings from Last 3 Encounters:  07/31/15 159 lb (72.122 kg)  12/16/13 152 lb 12.8 oz (69.31 kg)  01/22/11 170 lb (77.111 kg)    Physical Exam  Constitutional: He is oriented to person, place, and time. He appears well-developed and well-nourished. No distress.  HENT:  Head: Normocephalic and atraumatic.  Mouth/Throat: Oropharynx is clear and moist. No oropharyngeal exudate.  Eyes: Conjunctivae are normal. Right eye exhibits no discharge. Left eye exhibits no discharge. No scleral icterus.  Neck: Normal range of motion. Neck supple. No JVD present. No tracheal deviation present. No thyromegaly present.  Cardiovascular: Normal rate, regular rhythm, normal heart sounds and intact distal pulses.  Exam reveals no gallop and no friction rub.   No murmur heard. Pulmonary/Chest: Effort normal and breath sounds normal. No stridor. No respiratory distress. He has no wheezes. He has no rales. He exhibits no tenderness.  Abdominal: Soft. Bowel sounds are normal. He exhibits no distension and no mass. There is no tenderness. There is no rebound and no guarding. Hernia confirmed negative in the right  inguinal area and confirmed negative in the left inguinal area.  Genitourinary: Testes normal and penis normal.    Right testis shows no mass, no swelling and no tenderness. Right testis is descended. Left testis shows no mass, no swelling and no tenderness. Left  testis is descended. No penile erythema or penile tenderness. No discharge found.  Musculoskeletal: Normal range of motion. He exhibits no edema or tenderness.  Lymphadenopathy:    He has no cervical adenopathy.       Right: No inguinal adenopathy present.       Left: No inguinal adenopathy present.  Neurological: He is oriented to person, place, and time. He displays no atrophy, no tremor and normal reflexes. No cranial nerve deficit or sensory deficit. He exhibits normal muscle tone. He displays a negative Romberg sign. He displays no seizure activity. Coordination and gait normal.  Reflex Scores:      Tricep reflexes are 0 on the right side and 0 on the left side.      Bicep reflexes are 0 on the right side and 0 on the left side.      Brachioradialis reflexes are 0 on the right side and 0 on the left side.      Patellar reflexes are 1+ on the right side and 1+ on the left side.      Achilles reflexes are 1+ on the right side and 1+ on the left side. Positive bilateral Phalen's test Positive Tinel's test on the left Negative times test on the right  Skin: Skin is warm and dry. No rash noted. He is not diaphoretic. No erythema. No pallor.  Psychiatric: He has a normal mood and affect. His behavior is normal. Judgment and thought content normal.  Vitals reviewed.   Lab Results  Component Value Date   WBC 9.0 07/31/2015   HGB 14.6 07/31/2015   HCT 43.2 07/31/2015   PLT 234.0 07/31/2015   GLUCOSE 96 07/31/2015   CHOL 207* 07/31/2015   TRIG 185.0* 07/31/2015   HDL 37.30* 07/31/2015   LDLDIRECT 142.9 03/30/2008   LDLCALC 133* 07/31/2015   ALT 27 07/31/2015   AST 20 07/31/2015   NA 143 07/31/2015   K 3.8 07/31/2015   CL 107 07/31/2015   CREATININE 1.04 07/31/2015   BUN 16 07/31/2015   CO2 27 07/31/2015   TSH 0.67 07/31/2015   PSA 0.57 03/30/2008    No results found.  Assessment & Plan:   Samuel Mccarty was seen today for asthma and annual exam.  Diagnoses and all orders for  this visit:  Numbness and tingling of both upper extremities while sleeping- I have asked him to undergo a NCS/EMG testing to screen for carpal tunnel syndrome, cervical radiculopathy, demyelinating disease. His B12 level is borderline low so we will replete that as well. -     Vitamin B12; Future -     Folate; Future -     Ambulatory referral to Neurology  Routine general medical examination at a health care facility- he refused a flu vaccine, exam completed, labs ordered and reviewed, patient education material was given. -     Lipid panel; Future -     Comprehensive metabolic panel; Future -     CBC with Differential/Platelet; Future -     TSH; Future -     HIV antibody; Future  Mild intermittent asthma, uncomplicated- he will continue to use the albuterol as needed, he refuses to start an inhaled corticosteroid  Herpes genitalis in men- will treat  this with Valtrex -     valACYclovir (VALTREX) 1000 MG tablet; Take 1 tablet (1,000 mg total) by mouth 3 (three) times daily.  Vitamin B12 deficiency neuropathy- he has symptoms suspicious of B12 deficiency and a borderline low B12 level, will start treating the deficiency with a high-dose oral B12 supplement -     cyanocobalamin 2000 MCG tablet; Take 1 tablet (2,000 mcg total) by mouth daily.   I am having Samuel Mccarty start on valACYclovir and cyanocobalamin. I am also having him maintain his Albuterol Sulfate.  Meds ordered this encounter  Medications  . valACYclovir (VALTREX) 1000 MG tablet    Sig: Take 1 tablet (1,000 mg total) by mouth 3 (three) times daily.    Dispense:  21 tablet    Refill:  1  . cyanocobalamin 2000 MCG tablet    Sig: Take 1 tablet (2,000 mcg total) by mouth daily.    Dispense:  90 tablet    Refill:  3     Follow-up: Return in about 3 months (around 10/31/2015).  Scarlette Calico, MD

## 2015-07-31 NOTE — Progress Notes (Signed)
Pre visit review using our clinic review tool, if applicable. No additional management support is needed unless otherwise documented below in the visit note. 

## 2015-08-01 LAB — HIV ANTIBODY (ROUTINE TESTING W REFLEX): HIV 1&2 Ab, 4th Generation: NONREACTIVE

## 2015-08-06 ENCOUNTER — Other Ambulatory Visit: Payer: Self-pay | Admitting: *Deleted

## 2015-08-06 DIAGNOSIS — R202 Paresthesia of skin: Principal | ICD-10-CM

## 2015-08-06 DIAGNOSIS — R200 Anesthesia of skin: Secondary | ICD-10-CM

## 2015-08-07 ENCOUNTER — Ambulatory Visit (INDEPENDENT_AMBULATORY_CARE_PROVIDER_SITE_OTHER): Payer: Self-pay | Admitting: Neurology

## 2015-08-07 DIAGNOSIS — R200 Anesthesia of skin: Secondary | ICD-10-CM

## 2015-08-07 DIAGNOSIS — G5602 Carpal tunnel syndrome, left upper limb: Secondary | ICD-10-CM

## 2015-08-07 DIAGNOSIS — G5621 Lesion of ulnar nerve, right upper limb: Secondary | ICD-10-CM

## 2015-08-07 DIAGNOSIS — R202 Paresthesia of skin: Secondary | ICD-10-CM

## 2015-08-07 NOTE — Procedures (Signed)
[facility]  [address], [place]  [place], [place] [address] Tel: [phone] Fax:  [phone] Test Date:  08/07/2015  Patient: Samuel Mccarty DOB: 09/12/1975 Physician: Narda Amber, DO  Sex: Male Height: 6\' 0"  Ref Phys: Scarlette Calico, M.D.  ID#: [id number] Temp: 32.9C Technician: Jerilynn Mages. Dean   Patient Complaints: This is a 40 year old gentleman referred for evaluation of bilateral hand numbness.  NCV & EMG Findings: Extensive electrodiagnostic testing of the right upper extremity and additional studies of the left shows: 1. Left median sensory response shows prolonged distal peak latency (3.5 ms) and reduced amplitude (19.5 V).  bilateral ulnar and right median sensory responses are within normal limits. 2. Bilateral median and left ulnar motor responses are within normal limits. Right ulnar motor response shows an absolute conduction velocity slowing of 27 m/s across the elbow. 3. There is no evidence of active or chronic motor axon loss changes affecting any of the tested muscles.  Impression: 1. Right ulnar neuropathy with slowing across the elbow, purely demyelinating in type. 2. Left median neuropathy at or distal to the wrist, consistent with the clinical diagnosis of carpal tunnel syndrome. Overall, these findings are mild-to-moderate in degree electrically.   ___________________________ Narda Amber, DO    Nerve Conduction Studies Anti Sensory Summary Table   Site NR Peak (ms) Norm Peak (ms) P-T Amp (V) Norm P-T Amp  Left Median Anti Sensory (2nd Digit)  32.9C  Wrist    3.5 <3.4 19.5 >20  Right Median Anti Sensory (2nd Digit)  32.9C  Wrist    3.4 <3.4 20.7 >20  Left Ulnar Anti Sensory (5th Digit)  32.9C  Wrist    3.0 <3.1 18.8 >12  Right Ulnar Anti Sensory (5th Digit)  32.9C  Wrist    2.8 <3.1 13.6 >12   Motor Summary Table   Site NR Onset (ms) Norm Onset (ms) O-P Amp (mV) Norm O-P Amp Site1 Site2 Delta-0 (ms) Dist (cm) Vel (m/s) Norm Vel (m/s)   Left Median Motor (Abd Poll Brev)  32.9C  Wrist    3.8 <3.9 9.3 >6 Elbow Wrist 4.4 25.0 57 >50  Elbow    8.2  9.2         Right Median Motor (Abd Poll Brev)  32.9C  Wrist    3.4 <3.9 8.1 >6 Elbow Wrist 4.5 25.0 56 >50  Elbow    7.9  8.1         Left Ulnar Motor (Abd Dig Minimi)  32.9C  Wrist    3.1 <3.1 8.6 >7 B Elbow Wrist 3.9 21.0 54 >50  B Elbow    7.0  8.0  A Elbow B Elbow 1.8 10.0 56 >50  A Elbow    8.8  8.0         Right Ulnar Motor (Abd Dig Minimi)  32.9C  Wrist    2.4 <3.1 7.1 >7 B Elbow Wrist 4.6 23.0 50 >50  B Elbow    7.0  6.9  A Elbow B Elbow 1.3 10.0 77 >50  A Elbow    8.3  6.8          EMG   Side Muscle Ins Act Fibs Psw Fasc Number Recrt Dur Dur. Amp Amp. Poly Poly. Comment  Left 1stDorInt Nml Nml Nml Nml Nml Nml Nml Nml Nml Nml Nml Nml N/A  Left Abd Poll Brev Nml Nml Nml Nml Nml Nml Nml Nml Nml Nml Nml Nml N/A  Right 1stDorInt Nml Nml Nml Nml  Nml Nml Nml Nml Nml Nml Nml Nml N/A  Right Ext Indicis Nml Nml Nml Nml Nml Nml Nml Nml Nml Nml Nml Nml N/A  Right PronatorTeres Nml Nml Nml Nml Nml Nml Nml Nml Nml Nml Nml Nml N/A  Right Biceps Nml Nml Nml Nml Nml Nml Nml Nml Nml Nml Nml Nml N/A  Right Deltoid Nml Nml Nml Nml Nml Nml Nml Nml Nml Nml Nml Nml N/A  Right Triceps Nml Nml Nml Nml Nml Nml Nml Nml Nml Nml Nml Nml N/A  Right ABD Dig Min Nml Nml Nml Nml Nml Nml Nml Nml Nml Nml Nml Nml N/A  Right FlexDigProf 4,5 Nml Nml Nml Nml Nml Nml Nml Nml Nml Nml Nml Nml N/A  Left Ext Indicis Nml Nml Nml Nml Nml Nml Nml Nml Nml Nml Nml Nml N/A  Left PronatorTeres Nml Nml Nml Nml Nml Nml Nml Nml Nml Nml Nml Nml N/A  Left Biceps Nml Nml Nml Nml Nml Nml Nml Nml Nml Nml Nml Nml N/A  Left Triceps Nml Nml Nml Nml Nml Nml Nml Nml Nml Nml Nml Nml N/A  Left Deltoid Nml Nml Nml Nml Nml Nml Nml Nml Nml Nml Nml Nml N/A      Waveforms:

## 2015-08-08 ENCOUNTER — Other Ambulatory Visit: Payer: Self-pay

## 2015-08-08 ENCOUNTER — Encounter: Payer: Self-pay | Admitting: Internal Medicine

## 2015-08-08 ENCOUNTER — Telehealth: Payer: Self-pay | Admitting: Family

## 2015-08-08 ENCOUNTER — Ambulatory Visit (INDEPENDENT_AMBULATORY_CARE_PROVIDER_SITE_OTHER): Payer: Self-pay | Admitting: Internal Medicine

## 2015-08-08 VITALS — BP 106/70 | HR 69 | Temp 97.7°F | Resp 16 | Ht 67.0 in | Wt 162.0 lb

## 2015-08-08 DIAGNOSIS — R200 Anesthesia of skin: Secondary | ICD-10-CM

## 2015-08-08 DIAGNOSIS — R202 Paresthesia of skin: Secondary | ICD-10-CM

## 2015-08-08 DIAGNOSIS — L03119 Cellulitis of unspecified part of limb: Secondary | ICD-10-CM | POA: Insufficient documentation

## 2015-08-08 DIAGNOSIS — W57XXXA Bitten or stung by nonvenomous insect and other nonvenomous arthropods, initial encounter: Secondary | ICD-10-CM

## 2015-08-08 DIAGNOSIS — L02419 Cutaneous abscess of limb, unspecified: Secondary | ICD-10-CM

## 2015-08-08 MED ORDER — SULFAMETHOXAZOLE-TRIMETHOPRIM 800-160 MG PO TABS: 1.0000 | ORAL_TABLET | Freq: Two times a day (BID) | ORAL | Status: AC

## 2015-08-08 NOTE — Progress Notes (Signed)
Based on what you shared with me it looks like you have a serious condition that should be evaluated in a face to face office visit.  Due to the risk of tissue necrosis and other serious complications (tissue death, vascular damage, etc), please be seen face-to-face ASAP.   If you are having a true medical emergency please call 911.  If you need an urgent face to face visit, [place] has four urgent care centers for your convenience.  . [facility] a Provider at this Location  [address] [place], [place] [address] . 8 am to 8 pm Monday-Friday . 9 am to 7 pm Saturday-Sunday  . [facility] [place] a Provider at this Location  [address], [place], [place] [address] . 8 am to 8 pm Monday-Friday . 9 am to 6 pm Saturday . 11 am to 6 pm Sunday   . [facility] at [place] Get Driving Directions  [id number] [place], [place] [address] . 8 am to 8 pm Monday-Friday . 9 am to 4 pm Saturday-Sunday   . [facility] (a walk in primary care provider)  [place] a Provider at this Location  [place], [place] [address] . 8 am to 8:30 pm Monday-Thursday . 8 am to 6 pm Friday . 8 am to 4 pm Saturday-Sunday   Your e-visit answers were reviewed by a board certified advanced clinical practitioner to complete your personal care plan.  Thank you for using e-Visits.

## 2015-08-08 NOTE — Progress Notes (Signed)
Pre visit review using our clinic review tool, if applicable. No additional management support is needed unless otherwise documented below in the visit note. 

## 2015-08-08 NOTE — Patient Instructions (Signed)

## 2015-08-11 ENCOUNTER — Encounter: Payer: Self-pay | Admitting: Internal Medicine

## 2015-08-11 LAB — WOUND CULTURE: Gram Stain: NONE SEEN

## 2015-08-12 NOTE — Progress Notes (Signed)
Subjective:  Patient ID: CHE CISSE, male    DOB: 06/24/1975  Age: 40 y.o. MRN: [id number]  CC: Cellulitis   HPI Samuel Mccarty presents for a walk-in at appointment, he complains of about a 5 day history of a red/painful area on his left lower leg that has drained pus for a couple of days. He did not specifically see any injury, trauma, or insect bite.  He also needs follow-up on a recent NCS/EMG that was done to evaluate numbness and tingling in his upper extremities. He was found to have demyelination in the right arm and carpal tunnel syndrome in the left arm. He needs further evaluation regarding this.  Outpatient Prescriptions Prior to Visit  Medication Sig Dispense Refill  . Albuterol Sulfate (PROAIR RESPICLICK) 123XX123 (90 Base) MCG/ACT AEPB Inhale 1 puff into the lungs 4 (four) times daily as needed. 1 each 11  . cyanocobalamin 2000 MCG tablet Take 1 tablet (2,000 mcg total) by mouth daily. 90 tablet 3  . valACYclovir (VALTREX) 1000 MG tablet Take 1 tablet (1,000 mg total) by mouth 3 (three) times daily. 21 tablet 1   No facility-administered medications prior to visit.    ROS Review of Systems  Constitutional: Negative for fever, chills and fatigue.  HENT: Negative.  Negative for sore throat.   Eyes: Negative.   Respiratory: Negative.   Cardiovascular: Negative.   Gastrointestinal: Negative.  Negative for abdominal pain.  Endocrine: Negative.   Genitourinary: Negative.   Musculoskeletal: Negative.   Skin: Positive for color change and wound.  Allergic/Immunologic: Negative.   Neurological: Negative.   Hematological: Negative.  Negative for adenopathy. Does not bruise/bleed easily.    Objective:  BP 106/70 mmHg  Pulse 69  Temp(Src) 97.7 F (36.5 C) (Oral)  Ht 5\' 7"  (1.702 m)  Wt 162 lb (73.483 kg)  BMI 25.37 kg/m2  SpO2 98%  BP Readings from Last 3 Encounters:  08/08/15 106/70  07/31/15 110/70  12/16/13 114/74    Wt Readings from Last 3 Encounters:    08/08/15 162 lb (73.483 kg)  07/31/15 159 lb (72.122 kg)  12/16/13 152 lb 12.8 oz (69.31 kg)    Physical Exam  Musculoskeletal:       Left lower leg: He exhibits tenderness, swelling and deformity. He exhibits no bony tenderness, no edema and no laceration.       Legs: LLE - the area was cleaned with Betadine then prepped and draped in sterile fashion. Local anesthesia was obtained with instillation of 2% lidocaine with epi, approximately 3 mL were used. Next a 4 mm punch incision was made. A medium sized, loculated cavity was identified and a moderate amount of thick/purulent exudate was expressed. This was sent for culture. The cavity was irrigated with H2O2 then packed with iodoform. A dressing was applied. He tolerated this procedure well.    Lab Results  Component Value Date   WBC 9.0 07/31/2015   HGB 14.6 07/31/2015   HCT 43.2 07/31/2015   PLT 234.0 07/31/2015   GLUCOSE 96 07/31/2015   CHOL 207* 07/31/2015   TRIG 185.0* 07/31/2015   HDL 37.30* 07/31/2015   LDLDIRECT 142.9 03/30/2008   LDLCALC 133* 07/31/2015   ALT 27 07/31/2015   AST 20 07/31/2015   NA 143 07/31/2015   K 3.8 07/31/2015   CL 107 07/31/2015   CREATININE 1.04 07/31/2015   BUN 16 07/31/2015   CO2 27 07/31/2015   TSH 0.67 07/31/2015   PSA 0.57 03/30/2008    No  results found.  Assessment & Plan:   Samuel Mccarty was seen today for cellulitis.  Diagnoses and all orders for this visit:  Cellulitis and abscess of leg- incision and drainage completed, I've asked him to leave the iodoform packing in place for 2-3 days and then return for recheck. The culture is positive for staph so will treat with Bactrim DS. -     Wound culture; Future -     sulfamethoxazole-trimethoprim (BACTRIM DS,SEPTRA DS) 800-160 MG tablet; Take 1 tablet by mouth 2 (two) times daily.  Numbness and tingling of both upper extremities while sleeping- I've asked him to follow-up with neurology regarding the abnormal findings in his upper  extremities. -     Ambulatory referral to Neurology   I am having Samuel Mccarty start on sulfamethoxazole-trimethoprim. I am also having him maintain his Albuterol Sulfate, valACYclovir, and cyanocobalamin.  Meds ordered this encounter  Medications  . sulfamethoxazole-trimethoprim (BACTRIM DS,SEPTRA DS) 800-160 MG tablet    Sig: Take 1 tablet by mouth 2 (two) times daily.    Dispense:  10 tablet    Refill:  1     Follow-up: Return in about 3 days (around 08/11/2015).  Scarlette Calico, MD

## 2016-08-14 ENCOUNTER — Other Ambulatory Visit: Payer: Self-pay | Admitting: Internal Medicine

## 2016-08-14 DIAGNOSIS — J4520 Mild intermittent asthma, uncomplicated: Secondary | ICD-10-CM

## 2017-09-12 ENCOUNTER — Other Ambulatory Visit: Payer: Self-pay | Admitting: Internal Medicine

## 2017-09-12 DIAGNOSIS — J4520 Mild intermittent asthma, uncomplicated: Secondary | ICD-10-CM

## 2019-03-24 ENCOUNTER — Other Ambulatory Visit: Payer: Self-pay

## 2019-03-24 DIAGNOSIS — Z20822 Contact with and (suspected) exposure to covid-19: Secondary | ICD-10-CM

## 2019-03-26 LAB — NOVEL CORONAVIRUS, NAA: SARS-CoV-2, NAA: DETECTED — AB
